# Patient Record
Sex: Male | Born: 1985 | Race: Black or African American | Hispanic: No | Marital: Single | Smoking: Former smoker
Health system: Southern US, Community
[De-identification: ages and names within clinical notes are randomized; demographics above are authoritative.]

---
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
## 2011-11-25 ENCOUNTER — Encounter (HOSPITAL_COMMUNITY): Payer: Self-pay | Admitting: *Deleted

## 2011-11-25 ENCOUNTER — Emergency Department (HOSPITAL_COMMUNITY)
Admission: EM | Admit: 2011-11-25 | Discharge: 2011-11-25 | Disposition: A | Discharge status: Home / Self Care | Payer: Self-pay | Attending: Emergency Medicine | Admitting: Emergency Medicine

## 2011-11-25 DIAGNOSIS — R369 Urethral discharge, unspecified: Secondary | ICD-10-CM

## 2011-11-25 MED ORDER — AZITHROMYCIN 250 MG PO TABS
1000.0000 mg | ORAL_TABLET | Freq: Once | ORAL | Status: AC
  Administered 2011-11-25: 1000 mg via ORAL
  Filled 2011-11-25: qty 4

## 2011-11-25 MED ORDER — CEFTRIAXONE SODIUM 250 MG IJ SOLR
250.0000 mg | Freq: Once | INTRAMUSCULAR | Status: AC
  Administered 2011-11-25: 250 mg via INTRAMUSCULAR
  Filled 2011-11-25: qty 250

## 2011-11-25 MED ORDER — LIDOCAINE HCL 1 % IJ SOLN
INTRAMUSCULAR | Status: AC
  Administered 2011-11-25: 20 mL
  Filled 2011-11-25: qty 20

## 2011-11-25 NOTE — ED Provider Notes (Signed)
History     CSN: [id number]  Arrival date & time 11/25/11  1230   First MD Initiated Contact with Patient 11/25/11 1322      No chief complaint on file.   (Consider location/radiation/quality/duration/timing/severity/associated sxs/prior treatment) HPI The patient reports pain with urination that started this morning. He states that he has no discharge noted. No abd pain, N/V, or lesions.  History reviewed. No pertinent past medical history.  History reviewed. No pertinent past surgical history.  No family history on file.  History  Substance Use Topics  . Smoking status: Former Games developer  . Smokeless tobacco: Not on file  . Alcohol Use: 1.8 oz/week    3 Cans of beer per week      Review of Systems All pertinent positives and negatives reviewed in the history of present illness  Allergies  Review of patient's allergies indicates no known allergies.  Home Medications  No current outpatient prescriptions on file.  BP 131/80  Pulse 82  Temp(Src) 97.4 F (36.3 C) (Oral)  Resp 16  Ht 6' (1.829 m)  Wt 350 lb (158.759 kg)  BMI 47.47 kg/m2  SpO2 100%  Physical Exam  Constitutional: He is oriented to person, place, and time. He appears well-developed and well-nourished.  Cardiovascular: Normal rate and regular rhythm.   Pulmonary/Chest: Effort normal and breath sounds normal.  Genitourinary: Discharge found.  Neurological: He is alert and oriented to person, place, and time.  Skin: No rash noted.    ED Course  Procedures (including critical care time)   The patient will be treated for STDs based on his HPI and PE.        MDM          Carlyle Dolly, PA-C 11/25/11 1401
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
## 2011-11-25 NOTE — ED Provider Notes (Signed)
Medical screening examination/treatment/procedure(s) were performed by non-physician practitioner and as supervising physician I was immediately available for consultation/collaboration. Ireanna Finlayson, MD, FACEP   Coleman Kalas L Jennifermarie Franzen, MD 11/25/11 1849 

## 2011-11-26 LAB — GC/CHLAMYDIA PROBE AMP, GENITAL
Chlamydia, DNA Probe: NEGATIVE
GC Probe Amp, Genital: NEGATIVE

## 2015-03-25 ENCOUNTER — Emergency Department (HOSPITAL_COMMUNITY)
Admission: EM | Admit: 2015-03-25 | Discharge: 2015-03-25 | Disposition: A | Discharge status: Home / Self Care | Payer: Self-pay | Attending: Emergency Medicine | Admitting: Emergency Medicine

## 2015-03-25 ENCOUNTER — Encounter (HOSPITAL_COMMUNITY): Payer: Self-pay | Admitting: Emergency Medicine

## 2015-03-25 ENCOUNTER — Emergency Department (HOSPITAL_COMMUNITY): Payer: Self-pay

## 2015-03-25 DIAGNOSIS — Y9289 Other specified places as the place of occurrence of the external cause: Secondary | ICD-10-CM | POA: Insufficient documentation

## 2015-03-25 DIAGNOSIS — M25511 Pain in right shoulder: Secondary | ICD-10-CM

## 2015-03-25 DIAGNOSIS — S4991XA Unspecified injury of right shoulder and upper arm, initial encounter: Secondary | ICD-10-CM | POA: Insufficient documentation

## 2015-03-25 DIAGNOSIS — Z87891 Personal history of nicotine dependence: Secondary | ICD-10-CM | POA: Insufficient documentation

## 2015-03-25 DIAGNOSIS — X58XXXA Exposure to other specified factors, initial encounter: Secondary | ICD-10-CM | POA: Insufficient documentation

## 2015-03-25 DIAGNOSIS — Y9389 Activity, other specified: Secondary | ICD-10-CM | POA: Insufficient documentation

## 2015-03-25 DIAGNOSIS — Y990 Civilian activity done for income or pay: Secondary | ICD-10-CM | POA: Insufficient documentation

## 2015-03-25 MED ORDER — IBUPROFEN 800 MG PO TABS: 800.0000 mg | ORAL_TABLET | Freq: Three times a day (TID) | ORAL | Status: AC

## 2015-03-25 MED ORDER — IBUPROFEN 800 MG PO TABS
800.0000 mg | ORAL_TABLET | Freq: Once | ORAL | Status: AC
  Administered 2015-03-25: 800 mg via ORAL
  Filled 2015-03-25: qty 1

## 2015-03-25 NOTE — ED Notes (Signed)
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 No obvious deformities, complaining of pain to the back of his shoulder

## 2015-03-25 NOTE — ED Provider Notes (Signed)
CSN: [id number]     Arrival date & time 03/25/15  1639 History   First MD Initiated Contact with Patient 03/25/15 1644     Chief Complaint  Patient presents with  . 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 States he "felt something move, pop and detach itself". States it feels like his shoulder is dislocated. No history of dislocations in the past. States the pain is 7/10, worse in the back of the shoulder, nonradiating. Denies numbness or tingling down his arms. Pain worse when he tries to lift his arm. No alleviating factors tried.  Patient is a 29 y.o. male presenting with shoulder injury. The history is provided by the patient.  Shoulder Injury    History reviewed. No pertinent past medical history. History reviewed. No pertinent past surgical history. History reviewed. No pertinent family history. History  Substance Use Topics  . Smoking status: Former Games developer  . Smokeless tobacco: Not on file  . Alcohol Use: 1.8 oz/week    3 Cans of beer per week    Review of Systems  Musculoskeletal:       + R shoulder pain.  All other systems reviewed and are negative.     Allergies  Review of patient's allergies indicates no known allergies.  Home Medications   Prior to Admission medications   Medication Sig Start Date End Date Taking? Authorizing Provider  ibuprofen (ADVIL,MOTRIN) 800 MG tablet Take 1 tablet (800 mg total) by mouth 3 (three) times daily. 03/25/15   Kohler Pellerito M Tyri Elmore, PA-C   BP 130/82 mmHg  Pulse 87  Temp(Src) 99.2 F (37.3 C) (Oral)  Resp 18  SpO2 99% Physical Exam  Constitutional: He is oriented to person, place, and time. He appears well-developed and well-nourished. No distress.  Morbidly obese.  HENT:  Head: Normocephalic and atraumatic.   Eyes: Conjunctivae and EOM are normal.  Neck: Normal range of motion. Neck supple.  Cardiovascular: Normal rate, regular rhythm and normal heart sounds.   Pulses:      Radial pulses are 2+ on the right side.  Pulmonary/Chest: Effort normal and breath sounds normal.  Musculoskeletal: He exhibits no edema.  R shoulder- TTP posterior. No swelling or deformity. FROM, pain with flexion and abduction.  Neurological: He is alert and oriented to person, place, and time.  Strength UE 5/5 and equal BL.  Skin: Skin is warm and dry.  Psychiatric: He has a normal mood and affect. His behavior is normal.  Nursing note and vitals reviewed.   ED Course  Procedures (including critical care time) Labs Review Labs Reviewed - No data to display  Imaging Review Dg Shoulder Right  03/25/2015   CLINICAL DATA:  Right shoulder pain following stretching today. Initial encounter.  EXAM: RIGHT SHOULDER - 2+ VIEW  COMPARISON:  None.  FINDINGS: There is no evidence of fracture or dislocation. There is no evidence of arthropathy or other focal bone abnormality. Soft tissues are unremarkable.  IMPRESSION: Negative.   Electronically Signed   By: Harmon Pier M.D.   On: 03/25/2015 17:07     EKG Interpretation None      MDM   Final diagnoses:  Right shoulder pain   NAD. Neurovascularly intact. No swelling, bruising or deformity. X-ray negative. Able to perform full range of motion. Advised ice and NSAIDs. Follow-up with Ortho if no improvement in one  week. Stable for discharge. Return precautions given. Patient states understanding of treatment care plan and is agreeable.  Kathrynn Speed, PA-C 03/25/15 1742  Blane Ohara, MD 03/25/15 [phone]

## 2015-03-25 NOTE — Discharge Instructions (Signed)
Take ibuprofen as prescribed as needed for pain. Apply ice to your shoulder, 15 minutes at a time a few times a day. Follow-up with orthopedics if no improvement in one week.  Shoulder Pain The shoulder is the joint that connects your arms to your body. The bones that form the shoulder joint include the upper arm bone (humerus), the shoulder blade (scapula), and the collarbone (clavicle). The top of the humerus is shaped like a ball and fits into a rather flat socket on the scapula (glenoid cavity). A combination of muscles and strong, fibrous tissues that connect muscles to bones (tendons) support your shoulder joint and hold the ball in the socket. Small, fluid-filled sacs (bursae) are located in different areas of the joint. They act as cushions between the bones and the overlying soft tissues and help reduce friction between the gliding tendons and the bone as you move your arm. Your shoulder joint allows a wide range of motion in your arm. This range of motion allows you to do things like scratch your back or throw a ball. However, this range of motion also makes your shoulder more prone to pain from overuse and injury. Causes of shoulder pain can originate from both injury and overuse and usually can be grouped in the following four categories:  Redness, swelling, and pain (inflammation) of the tendon (tendinitis) or the bursae (bursitis).  Instability, such as a dislocation of the joint.  Inflammation of the joint (arthritis).  Broken bone (fracture). HOME CARE INSTRUCTIONS   Apply ice to the sore area.  Put ice in a plastic bag.  Place a towel between your skin and the bag.  Leave the ice on for 15-20 minutes, 3-4 times per day for the first 2 days, or as directed by your health care provider.  Stop using cold packs if they do not help with the pain.  If you have a shoulder sling or immobilizer, wear it as long as your caregiver instructs. Only remove it to shower or bathe. Move  your arm as little as possible, but keep your hand moving to prevent swelling.  Squeeze a soft ball or foam pad as much as possible to help prevent swelling.  Only take over-the-counter or prescription medicines for pain, discomfort, or fever as directed by your caregiver. SEEK MEDICAL CARE IF:   Your shoulder pain increases, or new pain develops in your arm, hand, or fingers.  Your hand or fingers become cold and numb.  Your pain is not relieved with medicines. SEEK IMMEDIATE MEDICAL CARE IF:   Your arm, hand, or fingers are numb or tingling.  Your arm, hand, or fingers are significantly swollen or turn white or blue. MAKE SURE YOU:   Understand these instructions.  Will watch your condition.  Will get help right away if you are not doing well or get worse. Document Released: 07/10/2005 Document Revised: 02/14/2014 Document Reviewed: 09/14/2011 [facility] Patient Information 2015 [place], [place]. This information is not intended to replace advice given to you by your health care provider. Make sure you discuss any questions you have with your health care provider.

## 2015-12-11 IMAGING — CR DG SHOULDER 2+V*R*
3 series · 3 of 3 positions shown · non-contrast
Comparison: None.

CLINICAL DATA: Right shoulder pain following stretching today.
Initial encounter.

EXAM:
RIGHT SHOULDER - 2+ VIEW

[w shoulder external right]
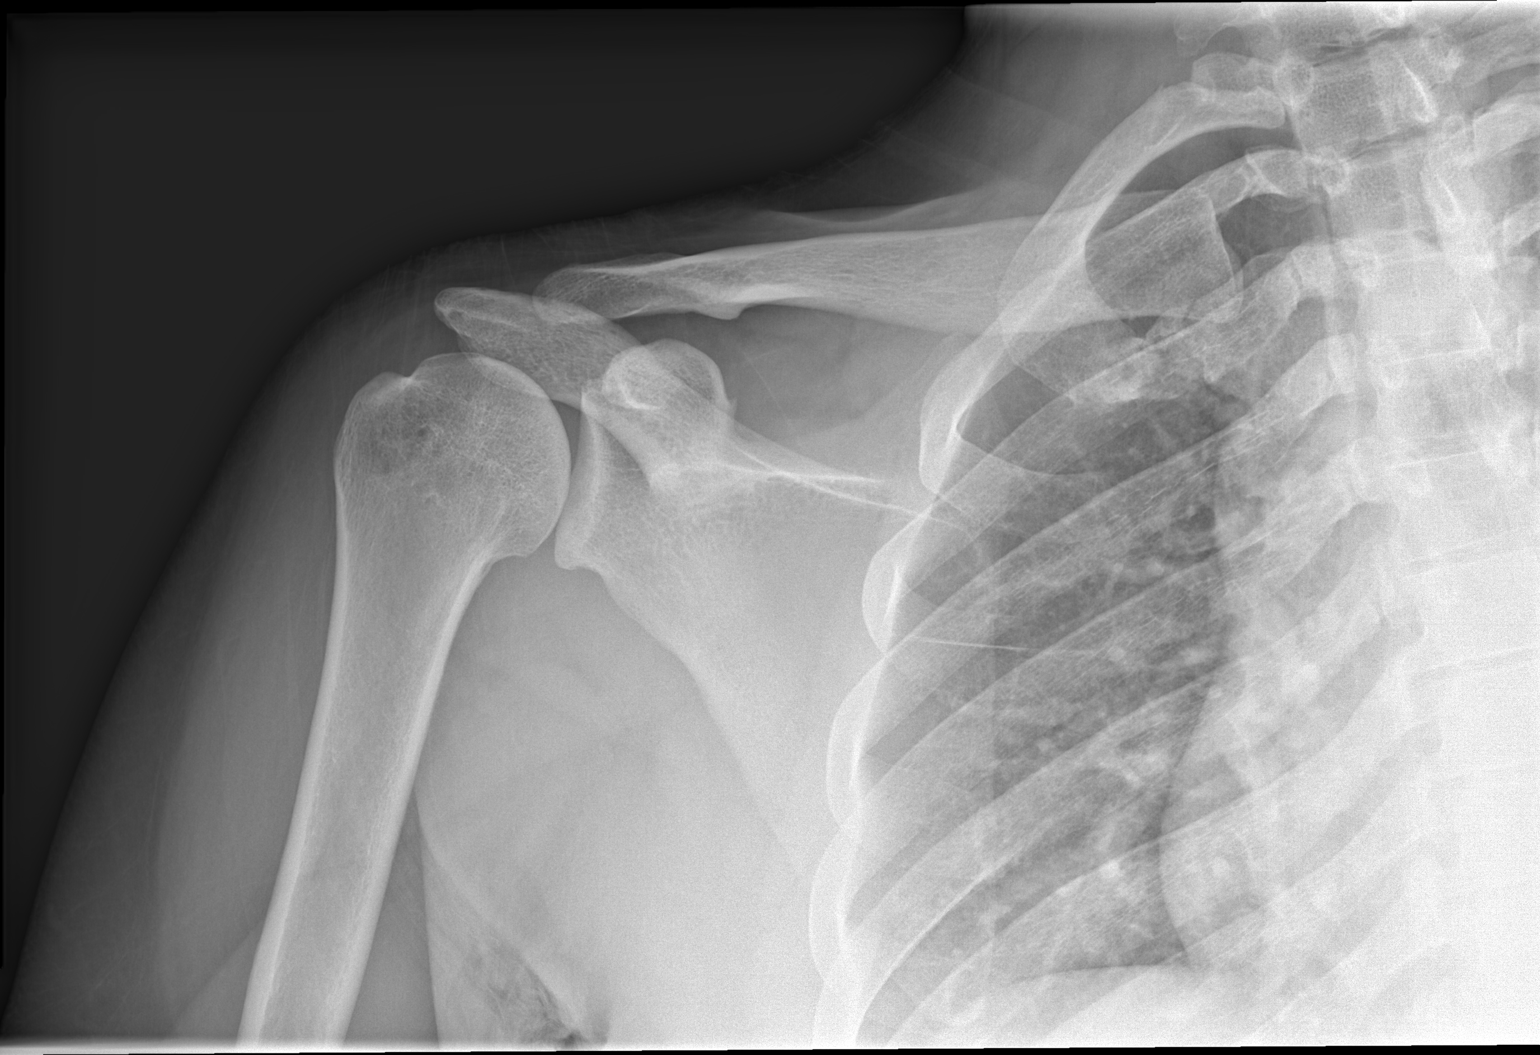

[w shoulder y-view right]
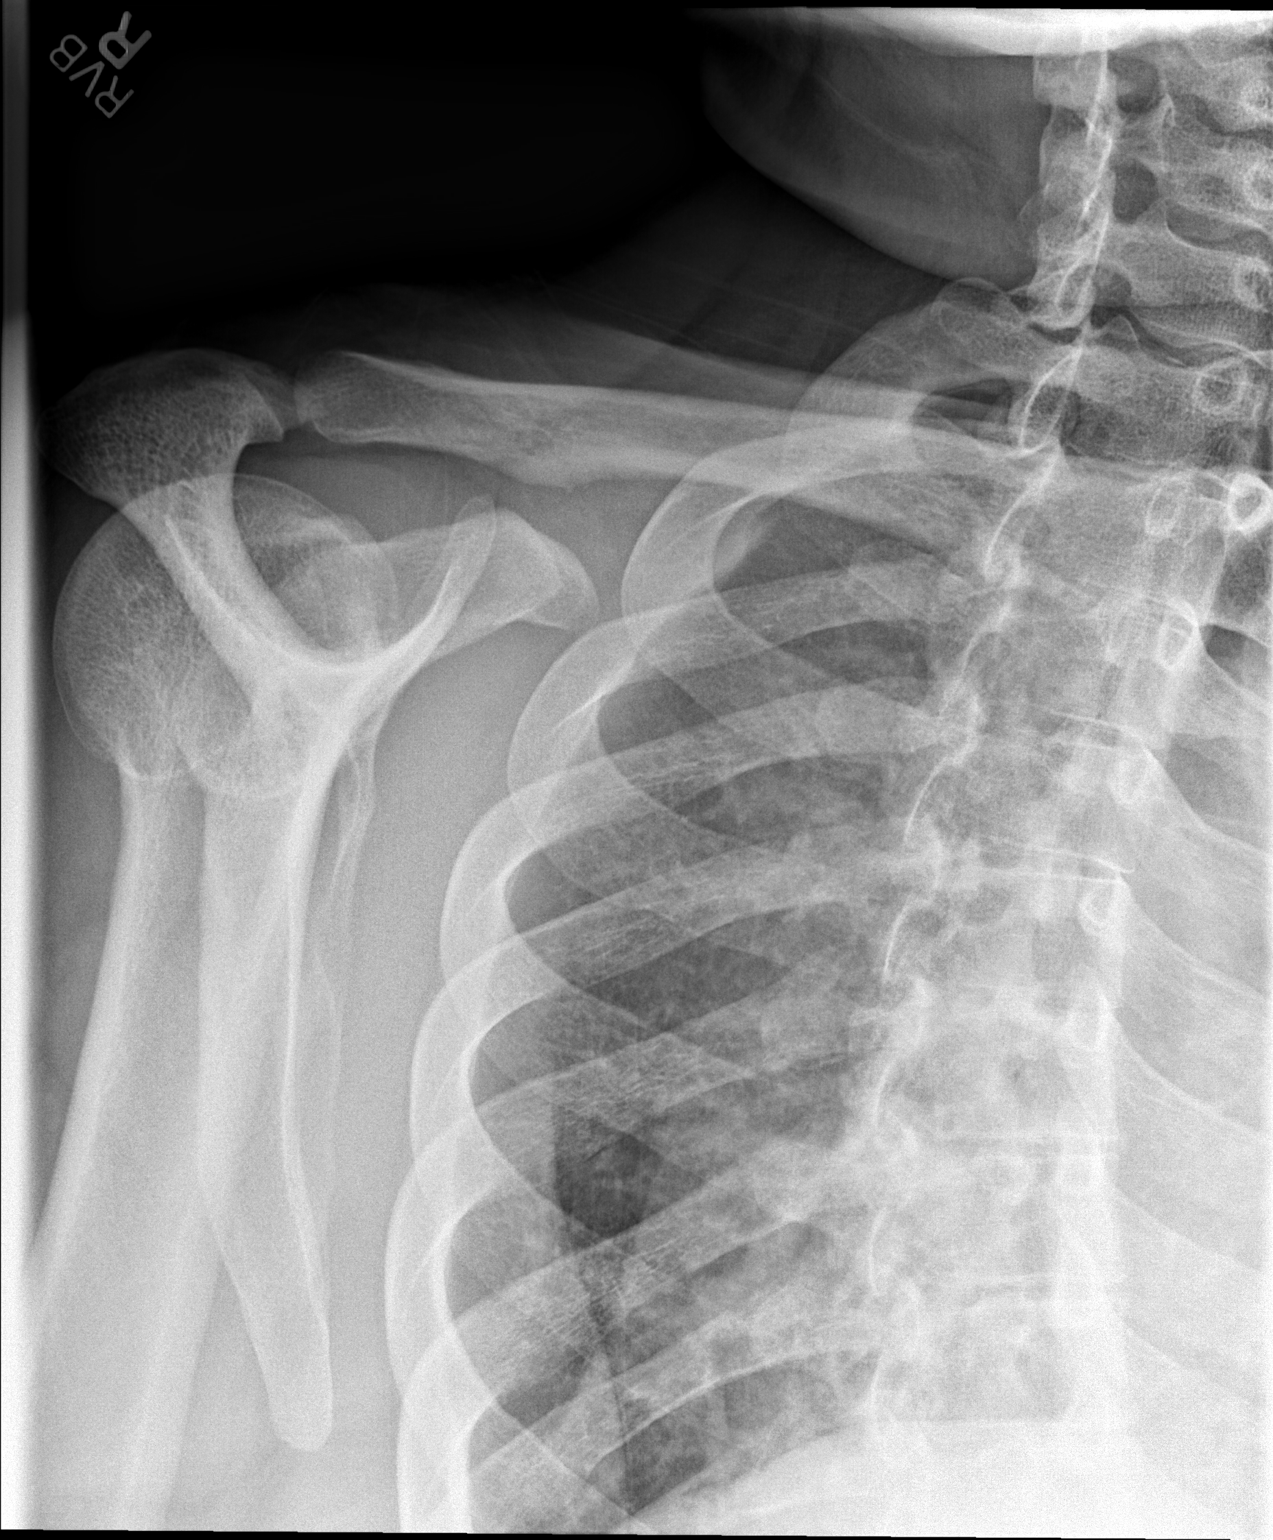

[x shoulder axillary right]
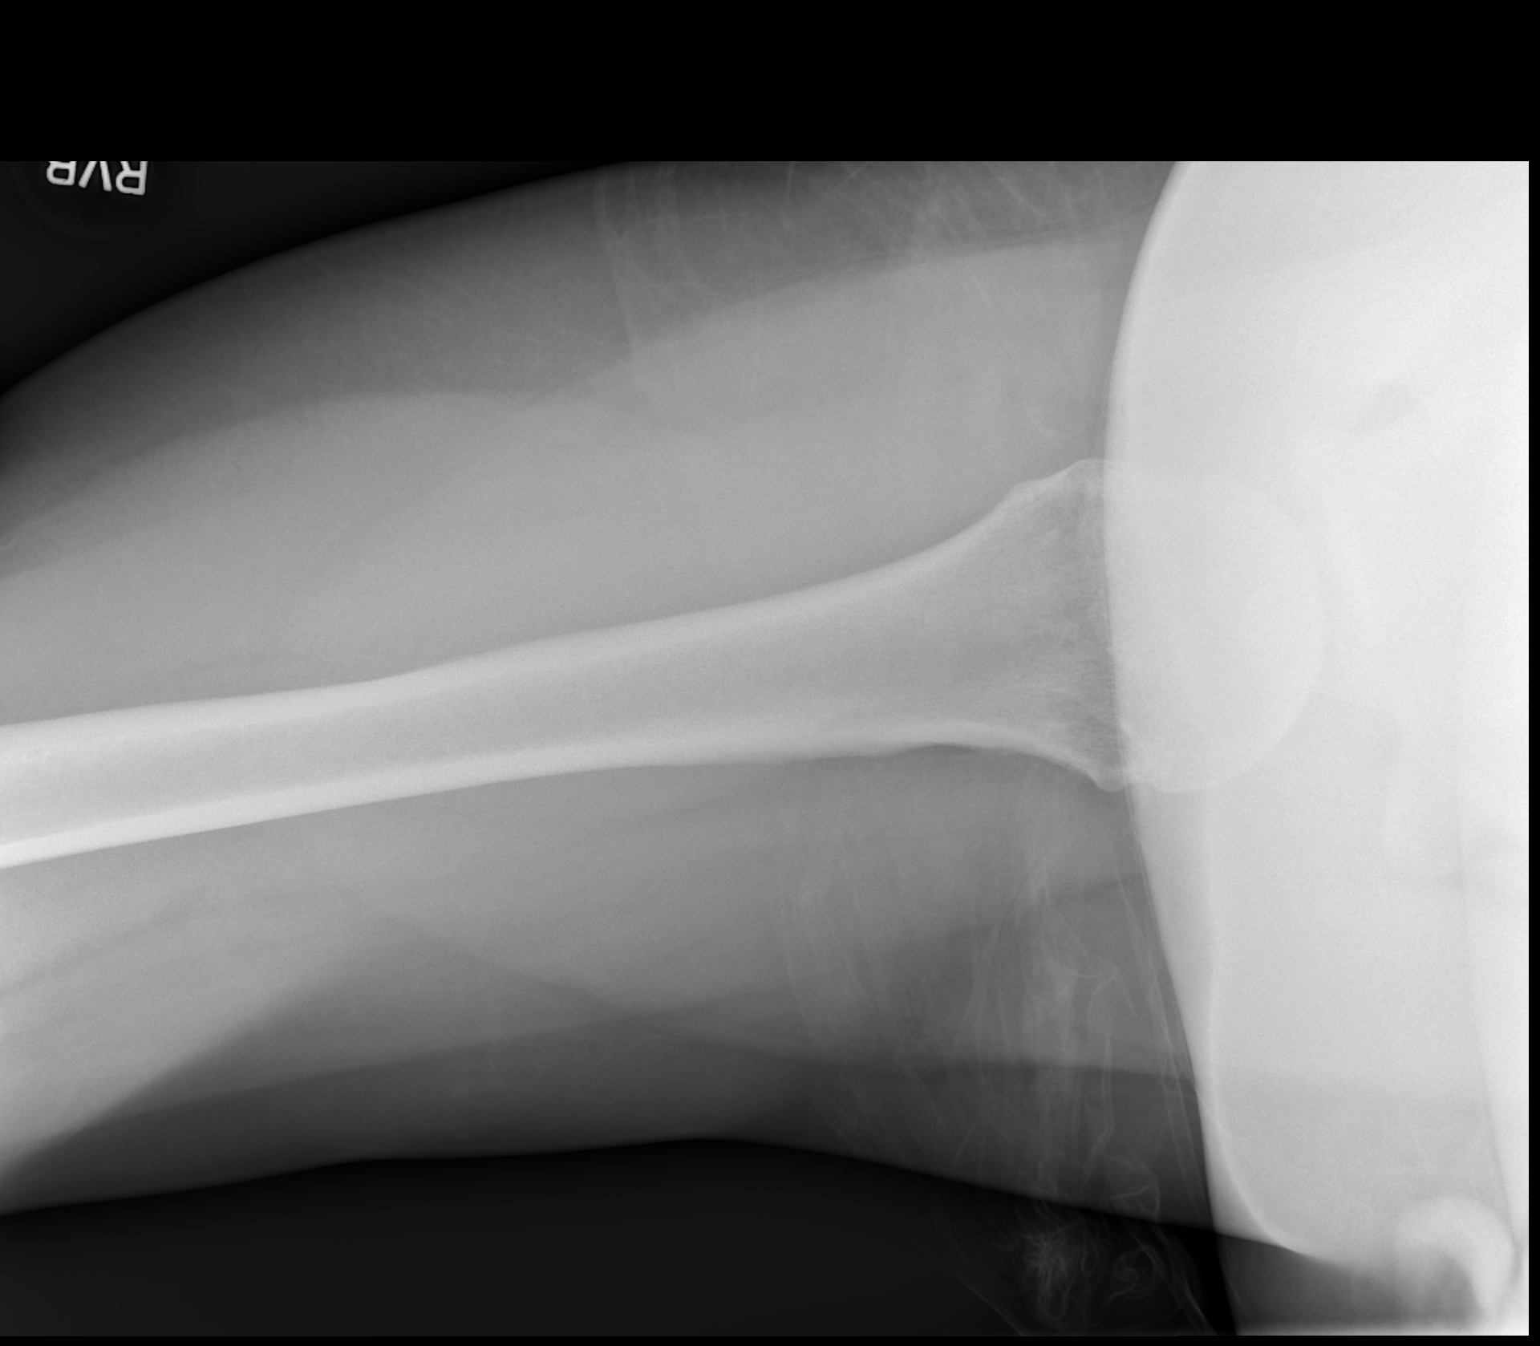

[3 of 3 positions shown; findings below may reference images not displayed]

FINDINGS: There is no evidence of fracture or dislocation. There is no
evidence of arthropathy or other focal bone abnormality. Soft
tissues are unremarkable.
IMPRESSION: Negative.

## 2017-09-13 DEATH — deceased
